# Patient Record
Sex: Male | Born: 1965 | Race: White | Hispanic: No | Smoking: Current every day smoker
Health system: Southern US, Community
[De-identification: ages and names within clinical notes are randomized; demographics above are authoritative.]

---

## 1998-04-03 ENCOUNTER — Emergency Department (HOSPITAL_COMMUNITY): Admission: EM | Admit: 1998-04-03 | Discharge: 1998-04-03 | Payer: Self-pay | Admitting: Emergency Medicine

## 1998-04-03 ENCOUNTER — Encounter: Payer: Self-pay | Admitting: Emergency Medicine

## 2009-12-25 ENCOUNTER — Emergency Department (HOSPITAL_COMMUNITY)
Admission: EM | Admit: 2009-12-25 | Discharge: 2009-12-25 | Payer: Self-pay | Source: Home / Self Care | Admitting: Emergency Medicine

## 2010-05-29 LAB — URINE MICROSCOPIC-ADD ON

## 2010-05-29 LAB — URINALYSIS, ROUTINE W REFLEX MICROSCOPIC
Glucose, UA: NEGATIVE mg/dL
Hgb urine dipstick: NEGATIVE
Leukocytes, UA: NEGATIVE
Nitrite: NEGATIVE
Protein, ur: 100 mg/dL — AB
Specific Gravity, Urine: 1.022 (ref 1.005–1.030)
Urobilinogen, UA: 1 mg/dL (ref 0.0–1.0)
pH: 6 (ref 5.0–8.0)

## 2013-12-31 ENCOUNTER — Emergency Department (HOSPITAL_COMMUNITY): Payer: Self-pay

## 2013-12-31 ENCOUNTER — Encounter (HOSPITAL_COMMUNITY): Payer: Self-pay | Admitting: Emergency Medicine

## 2013-12-31 ENCOUNTER — Emergency Department (HOSPITAL_COMMUNITY)
Admission: EM | Admit: 2013-12-31 | Discharge: 2013-12-31 | Disposition: A | Discharge status: Home / Self Care | Payer: Self-pay | Attending: Emergency Medicine | Admitting: Emergency Medicine

## 2013-12-31 DIAGNOSIS — Z792 Long term (current) use of antibiotics: Secondary | ICD-10-CM | POA: Insufficient documentation

## 2013-12-31 DIAGNOSIS — J209 Acute bronchitis, unspecified: Secondary | ICD-10-CM | POA: Insufficient documentation

## 2013-12-31 DIAGNOSIS — Z720 Tobacco use: Secondary | ICD-10-CM | POA: Insufficient documentation

## 2013-12-31 LAB — BASIC METABOLIC PANEL
Anion gap: 15 (ref 5–15)
BUN: 13 mg/dL (ref 6–23)
CHLORIDE: 101 meq/L (ref 96–112)
CO2: 23 mEq/L (ref 19–32)
CREATININE: 0.83 mg/dL (ref 0.50–1.35)
Calcium: 9.5 mg/dL (ref 8.4–10.5)
GFR calc Af Amer: >90 mL/min (ref >90)
GFR calc non Af Amer: >90 mL/min (ref >90)
Glucose, Bld: 126 mg/dL — ABNORMAL HIGH (ref 70–99)
POTASSIUM: 3.5 meq/L — AB (ref 3.7–5.3)
Sodium: 139 mEq/L (ref 137–147)

## 2013-12-31 LAB — CBC
HCT: 43 % (ref 39.0–52.0)
HEMOGLOBIN: 15.2 g/dL (ref 13.0–17.0)
MCH: 32.5 pg (ref 26.0–34.0)
MCHC: 35.3 g/dL (ref 30.0–36.0)
MCV: 92.1 fL (ref 78.0–100.0)
Platelets: 286 10*3/uL (ref 150–400)
RBC: 4.67 MIL/uL (ref 4.22–5.81)
RDW: 13.2 % (ref 11.5–15.5)
WBC: 8.1 10*3/uL (ref 4.0–10.5)

## 2013-12-31 MED ORDER — AEROCHAMBER Z-STAT PLUS/MEDIUM MISC
1.0000 | Freq: Once | Status: AC
  Administered 2013-12-31: 1
  Filled 2013-12-31 (×2): qty 1

## 2013-12-31 MED ORDER — AZITHROMYCIN 250 MG PO TABS: ORAL_TABLET | ORAL | Status: AC

## 2013-12-31 MED ORDER — ALBUTEROL SULFATE HFA 108 (90 BASE) MCG/ACT IN AERS
2.0000 | INHALATION_SPRAY | RESPIRATORY_TRACT | Status: DC | PRN
  Administered 2013-12-31: 2 via RESPIRATORY_TRACT
  Filled 2013-12-31: qty 6.7

## 2013-12-31 NOTE — ED Provider Notes (Signed)
CSN: [id number]     Arrival date & time 12/31/13  1330 History   First MD Initiated Contact with Patient 12/31/13 1606     Chief Complaint  Patient presents with  . Cough  . Hemoptysis     (Consider location/radiation/quality/duration/timing/severity/associated sxs/prior Treatment) HPI  Douglas Estes is a 48 y.o. male who presents for evaluation of cough for 2 weeks, occasionally productive of white sputum, with 2 episodes of blood mixed with the white sputum. Persistent fever. No nausea, vomiting, weakness, dizziness, chest pain, back pain, or abdominal pain. He has had 9 pound weight loss in the last few months, without plan. No anorexia or problems eating. He smokes cigarettes. There are no other known modifying factors.  History reviewed. No pertinent past medical history. History reviewed. No pertinent past surgical history. History reviewed. No pertinent family history. History  Substance Use Topics  . Smoking status: Current Every Day Smoker    Types: Cigarettes  . Smokeless tobacco: Not on file  . Alcohol Use: Yes    Review of Systems  All other systems reviewed and are negative.     Allergies  Review of patient's allergies indicates no known allergies.  Home Medications   Prior to Admission medications   Medication Sig Start Date End Date Taking? Authorizing Provider  Phenylephrine-Pheniramine-DM [facility](THERAFLU COLD & COUGH PO) Take 1-2 tablets by mouth every 6 (six) hours as needed (for cold symptoms).   Yes Historical Provider, MD  azithromycin (ZITHROMAX Z-PAK) 250 MG tablet 2 po day one, then 1 daily x 4 days 12/31/13   Flint MelterElliott L Chrysten Woulfe, MD   BP 118/76  Pulse 78  Temp(Src) 98.2 F (36.8 C) (Oral)  Resp 18  Ht 5\' 9"  (1.753 m)  Wt 216 lb 9 oz (98.232 kg)  BMI 31.97 kg/m2  SpO2 96% Physical Exam  Nursing note and vitals reviewed. Constitutional: He is oriented to person, place, and time. He appears well-developed and well-nourished. No distress.  HENT:   Head: Normocephalic and atraumatic.  Right Ear: External ear normal.  Left Ear: External ear normal.  Eyes: Conjunctivae and EOM are normal. Pupils are equal, round, and reactive to light.  Neck: Normal range of motion and phonation normal. Neck supple.  Cardiovascular: Normal rate, regular rhythm and normal heart sounds.   Pulmonary/Chest: Effort normal and breath sounds normal. No respiratory distress. He has no rales. He exhibits no tenderness and no bony tenderness.  Few scattered wheezes and rhonchi  Abdominal: Soft. There is no tenderness.  Musculoskeletal: Normal range of motion.  Neurological: He is alert and oriented to person, place, and time. No cranial nerve deficit or sensory deficit. He exhibits normal muscle tone. Coordination normal.  Skin: Skin is warm, dry and intact.  Psychiatric: He has a normal mood and affect. His behavior is normal. Judgment and thought content normal.    ED Course  Procedures (including critical care time)  Medications  albuterol (PROVENTIL HFA;VENTOLIN HFA) 108 (90 BASE) MCG/ACT inhaler 2 puff (not administered)  aerochamber Z-Stat Plus/medium 1 each (not administered)    Patient Vitals for the past 24 hrs:  BP Temp Temp src Pulse Resp SpO2 Height Weight  12/31/13 1700 118/76 mmHg - - 78 - 96 % - -  12/31/13 1650 129/70 mmHg - - 77 18 96 % - -  12/31/13 1630 129/70 mmHg - - 89 - 100 % - -  12/31/13 1600 117/68 mmHg - - 91 - 97 % - -  12/31/13 1348 160/80 mmHg  98.2 F (36.8 C) Oral 95 16 98 % 5\' 9"  (1.753 m) 216 lb 9 oz (98.232 kg)    5:11 PM Reevaluation with update and discussion. After initial assessment and treatment, an updated evaluation reveals no further complaints. Findings discussed with patient and wife, all questions answered. Chidera Thivierge L  Labs Review Labs Reviewed  BASIC METABOLIC PANEL - Abnormal; Notable for the following:    Potassium 3.5 (*)    Glucose, Bld 126 (*)    All other components within normal limits  CBC     Imaging Review Dg Chest 2 View  12/31/2013   CLINICAL DATA:  Cough and low-grade fever.  EXAM: CHEST  2 VIEW  COMPARISON:  None.  FINDINGS: There is some peribronchial thickening. No consolidative process, pneumothorax or effusion. Heart size is normal. No focal bony abnormality.  IMPRESSION: Bronchitic change without focal process.   Electronically Signed   By: Drusilla Kannerhomas  Dalessio M.D.   On: 12/31/2013 15:16     EKG Interpretation None      MDM   Final diagnoses:  Bronchitis, acute, with bronchospasm  Tobacco abuse    Bronchitis related to tobacco abuse. None this is an associated hemoptysis. Nonspecific weight loss, without malaise. Doubt cancer.   Nursing Notes Reviewed/ Care Coordinated Applicable Imaging Reviewed Interpretation of Laboratory Data incorporated into ED treatment  The patient appears reasonably screened and/or stabilized for discharge and I doubt any other medical condition or other [facility] requiring further screening, evaluation, or treatment in the ED at this time prior to discharge.  Plan: Home Medications- Zithrpmax, Albuterol; Home Treatments- Stop smoking; return here if the recommended treatment, does not improve the symptoms; Recommended follow up- PCP prn    Flint MelterElliott L Ranbir Chew, MD 12/31/13 1712

## 2013-12-31 NOTE — Discharge Instructions (Signed)
Acute Bronchitis Bronchitis is inflammation of the airways that extend from the windpipe into the lungs (bronchi). The inflammation often causes mucus to develop. This leads to a cough, which is the most common symptom of bronchitis.  In acute bronchitis, the condition usually develops suddenly and goes away over time, usually in a couple weeks. Smoking, allergies, and asthma can make bronchitis worse. Repeated episodes of bronchitis may cause further lung problems.  CAUSES Acute bronchitis is most often caused by the same virus that causes a cold. The virus can spread from person to person (contagious) through coughing, sneezing, and touching contaminated objects. SIGNS AND SYMPTOMS   Cough.   Fever.   Coughing up mucus.   Body aches.   Chest congestion.   Chills.   Shortness of breath.   Sore throat.  DIAGNOSIS  Acute bronchitis is usually diagnosed through a physical exam. Your health care provider will also ask you questions about your medical history. Tests, such as chest X-rays, are sometimes done to rule out other conditions.  TREATMENT  Acute bronchitis usually goes away in a couple weeks. Oftentimes, no medical treatment is necessary. Medicines are sometimes given for relief of fever or cough. Antibiotic medicines are usually not needed but may be prescribed in certain situations. In some cases, an inhaler may be recommended to help reduce shortness of breath and control the cough. A cool mist vaporizer may also be used to help thin bronchial secretions and make it easier to clear the chest.  HOME CARE INSTRUCTIONS  Get plenty of rest.   Drink enough fluids to keep your urine clear or pale yellow (unless you have a medical condition that requires fluid restriction). Increasing fluids may help thin your respiratory secretions (sputum) and reduce chest congestion, and it will prevent dehydration.   Take medicines only as directed by your health care provider.  If  you were prescribed an antibiotic medicine, finish it all even if you start to feel better.  Avoid smoking and secondhand smoke. Exposure to cigarette smoke or irritating chemicals will make bronchitis worse. If you are a smoker, consider using nicotine gum or skin patches to help control withdrawal symptoms. Quitting smoking will help your lungs heal faster.   Reduce the chances of another bout of acute bronchitis by washing your hands frequently, avoiding people with cold symptoms, and trying not to touch your hands to your mouth, nose, or eyes.   Keep all follow-up visits as directed by your health care provider.  SEEK MEDICAL CARE IF: Your symptoms do not improve after 1 week of treatment.  SEEK IMMEDIATE MEDICAL CARE IF:  You develop an increased fever or chills.   You have chest pain.   You have severe shortness of breath.  You have bloody sputum.   You develop dehydration.  You faint or repeatedly feel like you are going to pass out.  You develop repeated vomiting.  You develop a severe headache. MAKE SURE YOU:   Understand these instructions.  Will watch your condition.  Will get help right away if you are not doing well or get worse. Document Released: 04/09/2004 Document Revised: 07/17/2013 Document Reviewed: 08/23/2012 [facility] Patient Information 2015 [place], [place]. This information is not intended to replace advice given to you by your health care provider. Make sure you discuss any questions you have with your health care provider.  Smoking Cessation, Tips for Success If you are ready to quit smoking, congratulations! You have chosen to help yourself be healthier. Cigarettes bring  nicotine, tar, carbon monoxide, and other irritants into your body. Your lungs, heart, and blood vessels will be able to work better without these poisons. There are many different ways to quit smoking. Nicotine gum, nicotine patches, a nicotine inhaler, or nicotine nasal spray can  help with physical craving. Hypnosis, support groups, and medicines help break the habit of smoking. WHAT THINGS CAN I DO TO MAKE QUITTING EASIER?  Here are some tips to help you quit for good:  Pick a date when you will quit smoking completely. Tell all of your friends and family about your plan to quit on that date.  Do not try to slowly cut down on the number of cigarettes you are smoking. Pick a quit date and quit smoking completely starting on that day.  Throw away all cigarettes.   Clean and remove all ashtrays from your home, work, and car.  On a card, write down your reasons for quitting. Carry the card with you and read it when you get the urge to smoke.  Cleanse your body of nicotine. Drink enough water and fluids to keep your urine clear or pale yellow. Do this after quitting to flush the nicotine from your body.  Learn to predict your moods. Do not let a bad situation be your excuse to have a cigarette. Some situations in your life might tempt you into wanting a cigarette.  Never have "just one" cigarette. It leads to wanting another and another. Remind yourself of your decision to quit.  Change habits associated with smoking. If you smoked while driving or when feeling stressed, try other activities to replace smoking. Stand up when drinking your coffee. Brush your teeth after eating. Sit in a different chair when you read the paper. Avoid alcohol while trying to quit, and try to drink fewer caffeinated beverages. Alcohol and caffeine may urge you to smoke.  Avoid foods and drinks that can trigger a desire to smoke, such as sugary or spicy foods and alcohol.  Ask people who smoke not to smoke around you.  Have something planned to do right after eating or having a cup of coffee. For example, plan to take a walk or exercise.  Try a relaxation exercise to calm you down and decrease your stress. Remember, you may be tense and nervous for the first 2 weeks after you quit, but  this will pass.  Find new activities to keep your hands busy. Play with a pen, coin, or rubber band. Doodle or draw things on paper.  Brush your teeth right after eating. This will help cut down on the craving for the taste of tobacco after meals. You can also try mouthwash.   Use oral substitutes in place of cigarettes. Try using lemon drops, carrots, cinnamon sticks, or chewing gum. Keep them handy so they are available when you have the urge to smoke.  When you have the urge to smoke, try deep breathing.  Designate your home as a nonsmoking area.  If you are a heavy smoker, ask your health care provider about a prescription for nicotine chewing gum. It can ease your withdrawal from nicotine.  Reward yourself. Set aside the cigarette money you save and buy yourself something nice.  Look for support from others. Join a support group or smoking cessation program. Ask someone at home or at work to help you with your plan to quit smoking.  Always ask yourself, "Do I need this cigarette or is this just a reflex?" Tell yourself, "Today, I  choose not to smoke," or "I do not want to smoke." You are reminding yourself of your decision to quit.  Do not replace cigarette smoking with electronic cigarettes (commonly called e-cigarettes). The safety of e-cigarettes is unknown, and some may contain harmful chemicals.  If you relapse, do not give up! Plan ahead and think about what you will do the next time you get the urge to smoke. HOW WILL I FEEL WHEN I QUIT SMOKING? You may have symptoms of withdrawal because your body is used to nicotine (the addictive substance in cigarettes). You may crave cigarettes, be irritable, feel very hungry, cough often, get headaches, or have difficulty concentrating. The withdrawal symptoms are only temporary. They are strongest when you first quit but will go away within 10-14 days. When withdrawal symptoms occur, stay in control. Think about your reasons for quitting.  Remind yourself that these are signs that your body is healing and getting used to being without cigarettes. Remember that withdrawal symptoms are easier to treat than the major diseases that smoking can cause.  Even after the withdrawal is over, expect periodic urges to smoke. However, these cravings are generally short lived and will go away whether you smoke or not. Do not smoke! WHAT RESOURCES ARE AVAILABLE TO HELP ME QUIT SMOKING? Your health care provider can direct you to community resources or hospitals for support, which may include:  Group support.  Education. Smoking Cessation Quitting smoking is important to your health and has many advantages. However, it is not always easy to quit since nicotine is a very addictive drug. Oftentimes, people try 3 times or more before being able to quit. This document explains the best ways for you to prepare to quit smoking. Quitting takes hard work and a lot of effort, but you can do it. ADVANTAGES OF QUITTING SMOKING You will live longer, feel better, and live better. Your body will feel the impact of quitting smoking almost immediately. Within 20 minutes, blood pressure decreases. Your pulse returns to its normal level. After 8 hours, carbon monoxide levels in the blood return to normal. Your oxygen level increases. After 24 hours, the chance of having a heart attack starts to decrease. Your breath, hair, and body stop smelling like smoke. After 48 hours, damaged nerve endings begin to recover. Your sense of taste and smell improve. After 72 hours, the body is virtually free of nicotine. Your bronchial tubes relax and breathing becomes easier. After 2 to 12 weeks, lungs can hold more air. Exercise becomes easier and circulation improves. The risk of having a heart attack, stroke, cancer, or lung disease is greatly reduced. After 1 year, the risk of coronary heart disease is cut in half. After 5 years, the risk of stroke falls to the same as a  nonsmoker. After 10 years, the risk of lung cancer is cut in half and the risk of other cancers decreases significantly. After 15 years, the risk of coronary heart disease drops, usually to the level of a nonsmoker. If you are pregnant, quitting smoking will improve your chances of having a healthy baby. The people you live with, especially any children, will be healthier. You will have extra money to spend on things other than cigarettes. QUESTIONS TO THINK ABOUT BEFORE ATTEMPTING TO QUIT You may want to talk about your answers with your health care provider. Why do you want to quit? If you tried to quit in the past, what helped and what did not? What will be the most difficult  situations for you after you quit? How will you plan to handle them? Who can help you through the tough times? Your family? Friends? A health care provider? What pleasures do you get from smoking? What ways can you still get pleasure if you quit? Here are some questions to ask your health care provider: How can you help me to be successful at quitting? What medicine do you think would be best for me and how should I take it? What should I do if I need more help? What is smoking withdrawal like? How can I get information on withdrawal? GET READY Set a quit date. Change your environment by getting rid of all cigarettes, ashtrays, matches, and lighters in your home, car, or work. Do not let people smoke in your home. Review your past attempts to quit. Think about what worked and what did not. GET SUPPORT AND ENCOURAGEMENT You have a better chance of being successful if you have help. You can get support in many ways. Tell your family, friends, and coworkers that you are going to quit and need their support. Ask them not to smoke around you. Get individual, group, or telephone counseling and support. Programs are available at [facility]. Call your local health department for information about  programs in your area. Spiritual beliefs and practices may help some smokers quit. Download a "quit meter" on your computer to keep track of quit statistics, such as how long you have gone without smoking, cigarettes not smoked, and money saved. Get a self-help book about quitting smoking and staying off tobacco. LEARN NEW SKILLS AND BEHAVIORS Distract yourself from urges to smoke. Talk to someone, go for a walk, or occupy your time with a task. Change your normal routine. Take a different route to work. Drink tea instead of coffee. Eat breakfast in a different place. Reduce your stress. Take a hot bath, exercise, or read a book. Plan something enjoyable to do every day. Reward yourself for not smoking. Explore interactive web-based programs that specialize in helping you quit. GET MEDICINE AND USE IT CORRECTLY Medicines can help you stop smoking and decrease the urge to smoke. Combining medicine with the above behavioral methods and support can greatly increase your chances of successfully quitting smoking. Nicotine replacement therapy helps deliver nicotine to your body without the negative effects and risks of smoking. Nicotine replacement therapy includes nicotine gum, lozenges, inhalers, nasal sprays, and skin patches. Some may be available over-the-counter and others require a prescription. Antidepressant medicine helps people abstain from smoking, but how this works is unknown. This medicine is available by prescription. Nicotinic receptor partial agonist medicine simulates the effect of nicotine in your brain. This medicine is available by prescription. Ask your health care provider for advice about which medicines to use and how to use them based on your health history. Your health care provider will tell you what side effects to look out for if you choose to be on a medicine or therapy. Carefully read the information on the package. Do not use any other product containing nicotine while  using a nicotine replacement product.  RELAPSE OR DIFFICULT SITUATIONS Most relapses occur within the first 3 months after quitting. Do not be discouraged if you start smoking again. Remember, most people try several times before finally quitting. You may have symptoms of withdrawal because your body is used to nicotine. You may crave cigarettes, be irritable, feel very hungry, cough often, get headaches, or have difficulty concentrating. The withdrawal  symptoms are only temporary. They are strongest when you first quit, but they will go away within 10-14 days. To reduce the chances of relapse, try to: Avoid drinking alcohol. Drinking lowers your chances of successfully quitting. Reduce the amount of caffeine you consume. Once you quit smoking, the amount of caffeine in your body increases and can give you symptoms, such as a rapid heartbeat, sweating, and anxiety. Avoid smokers because they can make you want to smoke. Do not let weight gain distract you. Many smokers will gain weight when they quit, usually less than 10 pounds. Eat a healthy diet and stay active. You can always lose the weight gained after you quit. Find ways to improve your mood other than smoking. FOR MORE INFORMATION  www.smokefree.gov  Document Released: 02/24/2001 Document Revised: 07/17/2013 Document Reviewed: 06/11/2011 [facility] Patient Information 2015 [place]. This information is not intended to replace advice given to you by your health care provider. Make sure you discuss any questions you have with your health care provider.   Hypnosis.  Therapy. Document Released: 11/29/2003 Document Revised: 07/17/2013 Document Reviewed: 08/18/2012 [facility] Patient Information 2015 [place]. This information is not intended to replace advice given to you by your health care provider. Make sure you discuss any questions you have with your health care provider.

## 2013-12-31 NOTE — ED Notes (Signed)
Pt reports having nonproductive cough x 2 weeks. Having episodes of coughing up blood x 2 since Friday. Airway intact at triage. Denies any recent travel, night sweats or weight loss.

## 2013-12-31 NOTE — ED Notes (Signed)
resp therapist called 

## 2015-09-05 IMAGING — CR DG CHEST 2V
2 series · 2 of 2 positions shown · non-contrast
Comparison: None.

CLINICAL DATA: Cough and low-grade fever.

EXAM:
CHEST  2 VIEW

[w chest pa]
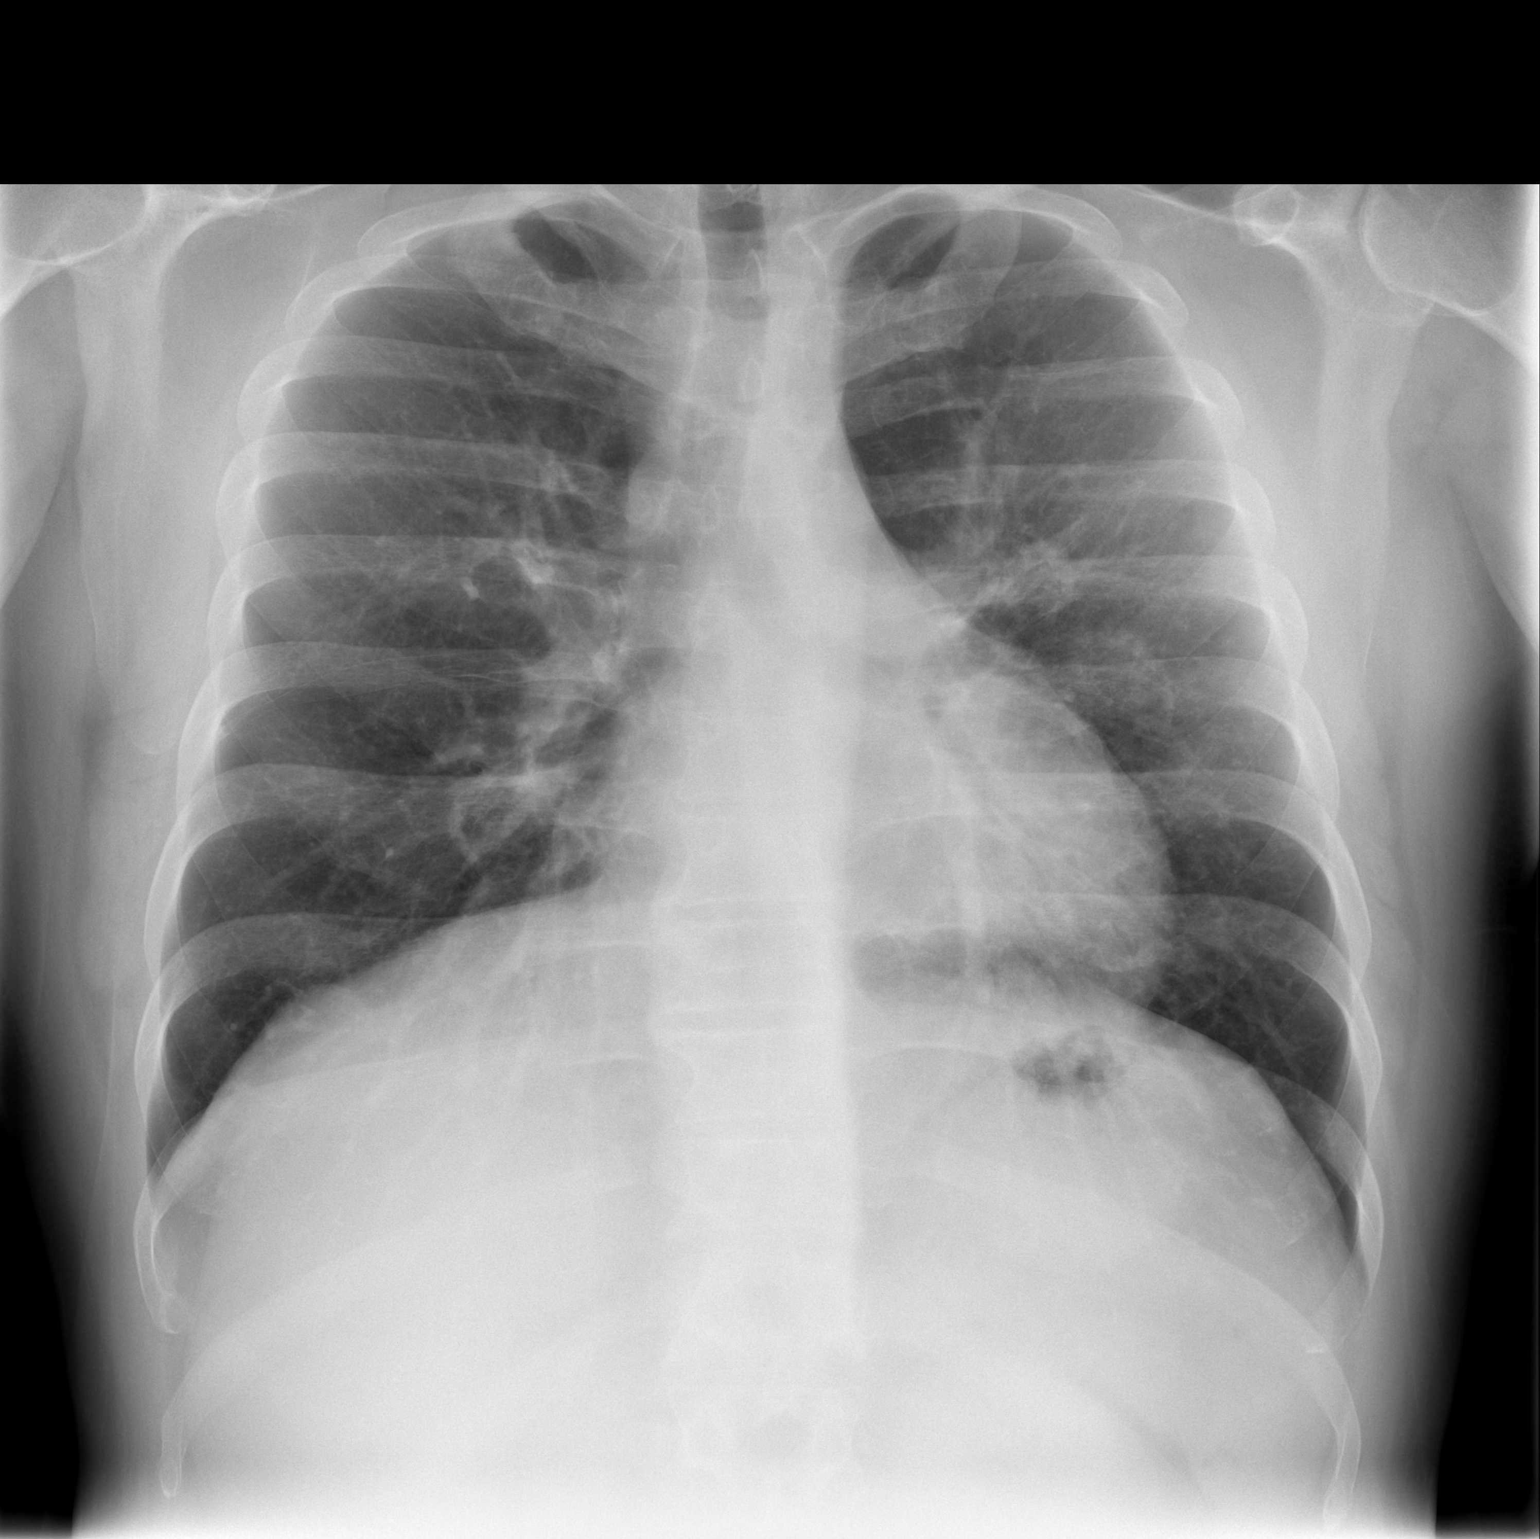

[w chest lat]
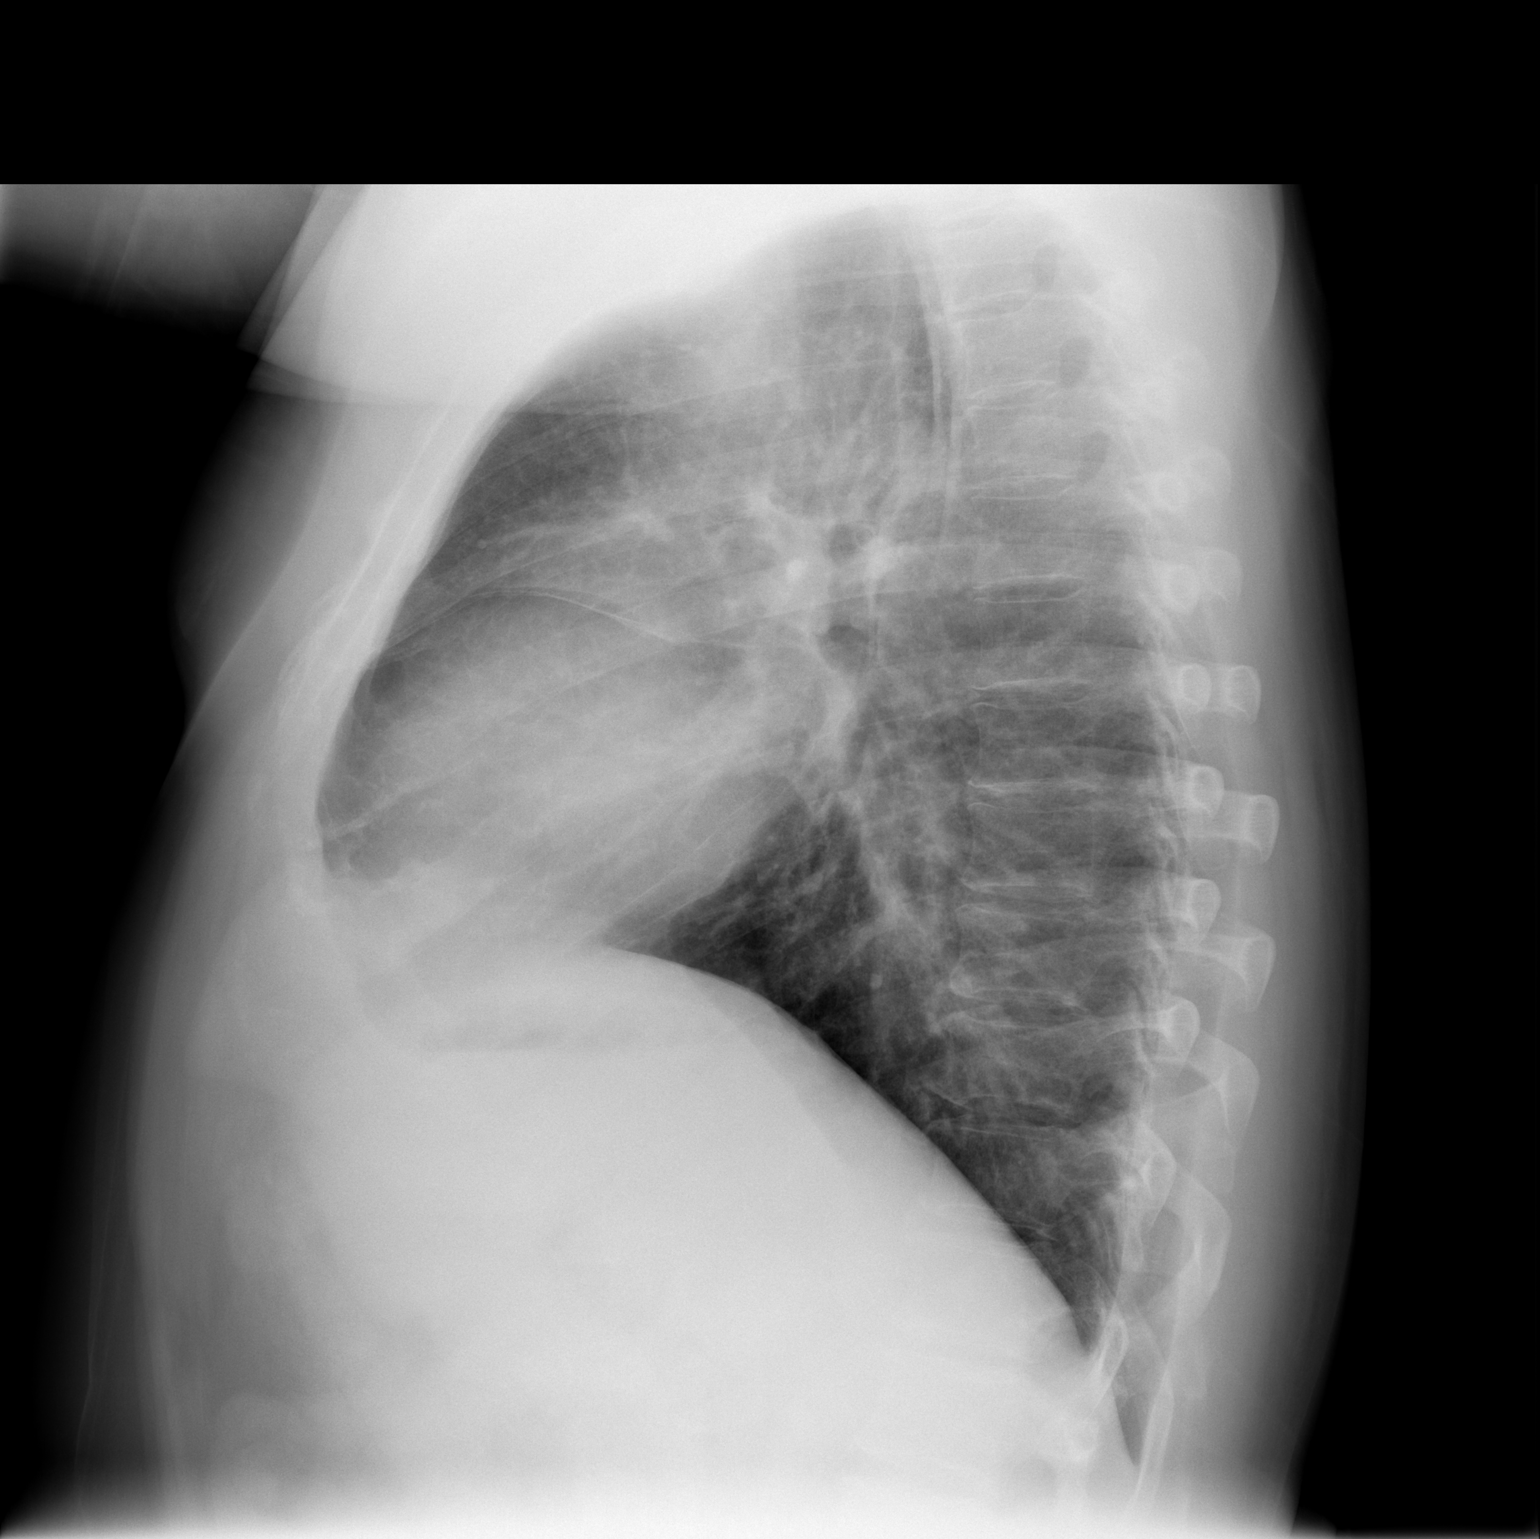

[2 of 2 positions shown; findings below may reference images not displayed]

FINDINGS: There is some peribronchial thickening. No consolidative process,
pneumothorax or effusion. Heart size is normal. No focal bony
abnormality.
IMPRESSION: Bronchitic change without focal process.
